# Patient Record
Sex: Male | Born: 1970 | Race: White | Hispanic: No | Marital: Married | Smoking: Former smoker
Health system: Southern US, Community
[De-identification: ages and names within clinical notes are randomized; demographics above are authoritative.]

## PROBLEM LIST (undated history)

## (undated) DIAGNOSIS — J40 Bronchitis, not specified as acute or chronic: Secondary | ICD-10-CM

## (undated) DIAGNOSIS — M1990 Unspecified osteoarthritis, unspecified site: Secondary | ICD-10-CM

## (undated) HISTORY — PX: NO PAST SURGERIES: SHX2092

---

## 2008-04-18 ENCOUNTER — Ambulatory Visit (HOSPITAL_COMMUNITY): Admission: RE | Admit: 2008-04-18 | Discharge: 2008-04-18 | Payer: Self-pay | Admitting: Family Medicine

## 2009-03-21 ENCOUNTER — Ambulatory Visit (HOSPITAL_COMMUNITY): Admission: RE | Admit: 2009-03-21 | Discharge: 2009-03-21 | Payer: Self-pay | Admitting: Family Medicine

## 2011-04-19 DIAGNOSIS — J40 Bronchitis, not specified as acute or chronic: Secondary | ICD-10-CM

## 2011-04-19 HISTORY — DX: Bronchitis, not specified as acute or chronic: J40

## 2011-06-23 ENCOUNTER — Other Ambulatory Visit (HOSPITAL_COMMUNITY): Payer: Self-pay | Admitting: Physician Assistant

## 2011-06-23 DIAGNOSIS — M25569 Pain in unspecified knee: Secondary | ICD-10-CM

## 2011-06-23 DIAGNOSIS — IMO0002 Reserved for concepts with insufficient information to code with codable children: Secondary | ICD-10-CM

## 2011-06-26 ENCOUNTER — Other Ambulatory Visit (HOSPITAL_COMMUNITY): Payer: Self-pay

## 2011-06-27 ENCOUNTER — Ambulatory Visit (HOSPITAL_COMMUNITY)
Admission: RE | Admit: 2011-06-27 | Discharge: 2011-06-27 | Disposition: A | Discharge status: Home / Self Care | Payer: BC Managed Care – PPO | Source: Ambulatory Visit | Attending: Physician Assistant | Admitting: Physician Assistant

## 2011-06-27 DIAGNOSIS — R937 Abnormal findings on diagnostic imaging of other parts of musculoskeletal system: Secondary | ICD-10-CM | POA: Insufficient documentation

## 2011-06-27 DIAGNOSIS — M25569 Pain in unspecified knee: Secondary | ICD-10-CM

## 2011-06-27 DIAGNOSIS — IMO0002 Reserved for concepts with insufficient information to code with codable children: Secondary | ICD-10-CM

## 2011-07-07 ENCOUNTER — Ambulatory Visit (INDEPENDENT_AMBULATORY_CARE_PROVIDER_SITE_OTHER): Payer: BC Managed Care – PPO | Admitting: Orthopedic Surgery

## 2011-07-07 ENCOUNTER — Encounter: Payer: Self-pay | Admitting: Orthopedic Surgery

## 2011-07-07 VITALS — BP 120/70 | Ht 68.0 in | Wt 196.0 lb

## 2011-07-07 DIAGNOSIS — M179 Osteoarthritis of knee, unspecified: Secondary | ICD-10-CM

## 2011-07-07 DIAGNOSIS — M222X9 Patellofemoral disorders, unspecified knee: Secondary | ICD-10-CM | POA: Insufficient documentation

## 2011-07-07 DIAGNOSIS — S8990XA Unspecified injury of unspecified lower leg, initial encounter: Secondary | ICD-10-CM

## 2011-07-07 DIAGNOSIS — M2240 Chondromalacia patellae, unspecified knee: Secondary | ICD-10-CM

## 2011-07-07 DIAGNOSIS — M23329 Other meniscus derangements, posterior horn of medial meniscus, unspecified knee: Secondary | ICD-10-CM

## 2011-07-07 DIAGNOSIS — M1710 Unilateral primary osteoarthritis, unspecified knee: Secondary | ICD-10-CM

## 2011-07-07 DIAGNOSIS — M25569 Pain in unspecified knee: Secondary | ICD-10-CM

## 2011-07-07 DIAGNOSIS — S99929A Unspecified injury of unspecified foot, initial encounter: Secondary | ICD-10-CM

## 2011-07-07 NOTE — Patient Instructions (Addendum)
You have been scheduled for surgery.  All surgeries carry some risk.  Remember you always have the option of continued nonsurgical treatment. However in this situation the risks vs. the benefits favor surgery as the best treatment option. The risks of the surgery includes the following but is not limited to bleeding, infection, pulmonary embolus, death from anesthesia, nerve injury vascular injury or need for further surgery, continued pain.  Arthroscopic Procedure, Knee An arthroscopic procedure can find what is wrong with your knee. PROCEDURE Arthroscopy is a surgical technique that allows your orthopedic surgeon to diagnose and treat your knee injury with accuracy. They will look into your knee through a small instrument. This is almost like a small (pencil sized) telescope. Because arthroscopy affects your knee less than open knee surgery, you can anticipate a more rapid recovery. Taking an active role by following your caregiver's instructions will help with rapid and complete recovery. Use crutches, rest, elevation, ice, and knee exercises as instructed. The length of recovery depends on various factors including type of injury, age, physical condition, medical conditions, and your rehabilitation. Your knee is the joint between the large bones (femur and tibia) in your leg. Cartilage covers these bone ends which are smooth and slippery and allow your knee to bend and move smoothly. Two menisci, thick, semi-lunar shaped pads of cartilage which form a rim inside the joint, help absorb shock and stabilize your knee. Ligaments bind the bones together and support your knee joint. Muscles move the joint, help support your knee, and take stress off the joint itself. Because of this all programs and physical therapy to rehabilitate an injured or repaired knee require rebuilding and strengthening your muscles. AFTER THE PROCEDURE  After the procedure, you will be moved to a recovery area until most of the  effects of the medication have worn off. Your caregiver will discuss the test results with you.     Only take over-the-counter or prescription medicines for pain, discomfort, or fever as directed by your caregiver.  SEEK MEDICAL CARE IF:    You have increased bleeding from your wounds.     You see redness, swelling, or have increasing pain in your wounds.     You have pus coming from your wound.     You have an oral temperature above 102 F (38.9 C).     You notice a bad smell coming from the wound or dressing.     You have severe pain with any motion of your knee.  SEEK IMMEDIATE MEDICAL CARE IF:    You develop a rash.     You have difficulty breathing.     You have any allergic problems.  Document Released: 05/02/2000 Document Revised: 01/15/2011 Document Reviewed: 11/24/2007 [facility] Patient Information 2012 [place], [place].Arthroscopic Procedure, Knee An arthroscopic procedure can find what is wrong with your knee. PROCEDURE Arthroscopy is a surgical technique that allows your orthopedic surgeon to diagnose and treat your knee injury with accuracy. They will look into your knee through a small instrument. This is almost like a small (pencil sized) telescope. Because arthroscopy affects your knee less than open knee surgery, you can anticipate a more rapid recovery. Taking an active role by following your caregiver's instructions will help with rapid and complete recovery. Use crutches, rest, elevation, ice, and knee exercises as instructed. The length of recovery depends on various factors including type of injury, age, physical condition, medical conditions, and your rehabilitation. Your knee is the joint between the large bones (femur  and tibia) in your leg. Cartilage covers these bone ends which are smooth and slippery and allow your knee to bend and move smoothly. Two menisci, thick, semi-lunar shaped pads of cartilage which form a rim inside the joint, help absorb shock and  stabilize your knee. Ligaments bind the bones together and support your knee joint. Muscles move the joint, help support your knee, and take stress off the joint itself. Because of this all programs and physical therapy to rehabilitate an injured or repaired knee require rebuilding and strengthening your muscles. AFTER THE PROCEDURE  After the procedure, you will be moved to a recovery area until most of the effects of the medication have worn off. Your caregiver will discuss the test results with you.     Only take over-the-counter or prescription medicines for pain, discomfort, or fever as directed by your caregiver.  SEEK MEDICAL CARE IF:    You have increased bleeding from your wounds.     You see redness, swelling, or have increasing pain in your wounds.     You have pus coming from your wound.     You have an oral temperature above 102 F (38.9 C).     You notice a bad smell coming from the wound or dressing.     You have severe pain with any motion of your knee.  SEEK IMMEDIATE MEDICAL CARE IF:    You develop a rash.     You have difficulty breathing.     You have any allergic problems.  Document Released: 05/02/2000 Document Revised: 01/15/2011 Document Reviewed: 11/24/2007 [facility] Patient Information 2012 [place], [place].  Call office if you need the knee drained

## 2011-07-07 NOTE — Progress Notes (Signed)
  Subjective:    Ryan Robbins is a 41 y.o. male who presents history of remote hyperextension injury to her LEFT knee playing softball about 9 years ago.  He did well basically up until this January when he was helping his father-in-law carry some cabinets up several flights of stairs.  Since that time is any increased pain on the medial side of the knee where as before he only had intermittent symptoms.  He complains of catching and swelling as well as sharp stabbing medial knee pain which he rates 6/10 and described as constant.  Hydrocodone did not relieve his pain but Vicodin which he took for back pain before this knee problem seemed to help much better.  He had some giving way episodes but none recently.  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  He had an MRI which showed he had "PCL abnormality" as well as degeneration of the medial meniscus with possible tear.  There is cyst formation on the medial compartment with joint space narrowing.  There was mild chondromalacia of the medial femoral compartment as well as patellofemoral compartment.  The following portions of the patient's history were reviewed and updated as appropriate: allergies, current medications, past family history, past medical history, past social history, past surgical history and problem list.   Review of Systems A comprehensive review of systems was negative.   Objective:    BP 120/70  Ht 5\' 8"  (1.727 m)  Wt 196 lb (88.905 kg)  BMI 29.80 kg/m2  Physical Exam(12) GENERAL: normal development , Normal frame.  Normal grooming normal hygiene.  CDV: pulses are normal   Skin: normal  Lymph: nodes were not palpable/normal  Psychiatric: awake, alert and oriented  Neuro: normal sensation  Ambulation is normal.  Right knee: normal and no effusion, full active range of motion, no joint line tenderness, ligamentous structures intact.  Left knee:    the patient exhibits a posterior tibial sag.  A 2+ posterior drawer.  A false anterior drawer.  Normal Lachman test.  Negative McMurray's.  Medial joint line tenderness.  Normal patellofemoral joint motion no apprehension.  Collateral ligaments are stable.  30 and 90 external rotation tests are normal and equal to the opposite side collateral ligaments are stable   X-ray left knee: alignment medial joint space collapse consistent with varus and loss of cartilage medially with surrounding osteophytes.  Assessment:    Left torn medial meniscus degenerative, degenerative arthritis medial compartment, PCL tear chronic.  Chondromalacia patella.    Plan:    I discussed with the patient he will need a knee replacement later in life.  I advised him to have a scope to clean up the medial compartment.  I advised him that he would not be pain free just have less pain.  I think at this point reconstruction will not prevent deterioration of the knee which is already occurred.  He will let us know when he can have the surgery.

## 2011-07-30 ENCOUNTER — Encounter: Payer: Self-pay | Admitting: Orthopedic Surgery

## 2011-07-30 ENCOUNTER — Ambulatory Visit (INDEPENDENT_AMBULATORY_CARE_PROVIDER_SITE_OTHER): Payer: BC Managed Care – PPO | Admitting: Orthopedic Surgery

## 2011-07-30 VITALS — Ht 68.0 in | Wt 196.0 lb

## 2011-07-30 DIAGNOSIS — S99919A Unspecified injury of unspecified ankle, initial encounter: Secondary | ICD-10-CM

## 2011-07-30 DIAGNOSIS — M222X9 Patellofemoral disorders, unspecified knee: Secondary | ICD-10-CM

## 2011-07-30 DIAGNOSIS — M1710 Unilateral primary osteoarthritis, unspecified knee: Secondary | ICD-10-CM

## 2011-07-30 DIAGNOSIS — M23329 Other meniscus derangements, posterior horn of medial meniscus, unspecified knee: Secondary | ICD-10-CM

## 2011-07-30 DIAGNOSIS — S8990XA Unspecified injury of unspecified lower leg, initial encounter: Secondary | ICD-10-CM

## 2011-07-30 DIAGNOSIS — M25569 Pain in unspecified knee: Secondary | ICD-10-CM

## 2011-07-30 MED ORDER — HYDROCODONE-ACETAMINOPHEN 5-500 MG PO TABS: 1.0000 | ORAL_TABLET | Freq: Four times a day (QID) | ORAL | Status: DC | PRN

## 2011-07-30 NOTE — Progress Notes (Signed)
Patient ID: Ryan Robbins, male   DOB: 1970-10-26, 41 y.o.   MRN: [id number] Chief Complaint  Patient presents with  . Follow-up    Schedule surgery on left knee.     The patient is scheduled for reevaluation of his knee and possible surgical treatment of the LEFT knee  The MRI shows that he has abnormal signal in his PCL and clinical exam confirms a PCL injury that is chronic.  Also has medial and patellofemoral compartment disease which is consistent with an old PCL tear  The medial meniscus looks to be degenerative and may need resection.  He will also knee exam under anesthesia.  The patient is counseled on his postoperative rehabilitation course  Risks and benefits are explained  Plan is for arthroscopy LEFT knee possible medial meniscectomy with exam under anesthesia to evaluate the PCL and collateral ligament restraints.

## 2011-07-30 NOTE — Patient Instructions (Signed)
You have been scheduled for surgery.  All surgeries carry some risk.  Remember you always have the option of continued nonsurgical treatment. However in this situation the risks vs. the benefits favor surgery as the best treatment option. The risks of the surgery includes the following but is not limited to bleeding, infection, pulmonary embolus, death from anesthesia, nerve injury vascular injury or need for further surgery, continued pain.  Specific to this procedure the following risks and complications are rare but possible Stiffness, pain, weakness, I expect  3-4 weeks recovery

## 2011-07-31 ENCOUNTER — Telehealth: Payer: Self-pay | Admitting: Orthopedic Surgery

## 2011-07-31 NOTE — Telephone Encounter (Signed)
Contacted insurer, [address] [place], [place] [phone], re: out-patient surgery scheduled 08/08/11 at [facility], [place] [address], [phone], ICD9 codes 719.46, 959.7, 717.2, 715.96 - Per Reche Dixon, Care management operations representative, no pre-authorization is required.  Her name and today's date for reference.

## 2011-08-01 ENCOUNTER — Encounter (HOSPITAL_COMMUNITY): Payer: Self-pay

## 2011-08-01 ENCOUNTER — Encounter (HOSPITAL_COMMUNITY)
Admission: RE | Admit: 2011-08-01 | Discharge: 2011-08-01 | Disposition: A | Discharge status: Home / Self Care | Payer: BC Managed Care – PPO | Source: Ambulatory Visit | Attending: Orthopedic Surgery | Admitting: Orthopedic Surgery

## 2011-08-01 HISTORY — DX: Bronchitis, not specified as acute or chronic: J40

## 2011-08-01 HISTORY — DX: Unspecified osteoarthritis, unspecified site: M19.90

## 2011-08-01 LAB — SURGICAL PCR SCREEN: Staphylococcus aureus: NEGATIVE

## 2011-08-01 LAB — BASIC METABOLIC PANEL
CO2: 29 mEq/L (ref 19–32)
Calcium: 10.1 mg/dL (ref 8.4–10.5)
Chloride: 101 mEq/L (ref 96–112)
Glucose, Bld: 109 mg/dL — ABNORMAL HIGH (ref 70–99)
Potassium: 4.3 mEq/L (ref 3.5–5.1)
Sodium: 141 mEq/L (ref 135–145)

## 2011-08-01 LAB — HEMOGLOBIN AND HEMATOCRIT, BLOOD
HCT: 45.2 % (ref 39.0–52.0)
Hemoglobin: 16.4 g/dL (ref 13.0–17.0)

## 2011-08-01 MED ORDER — CHLORHEXIDINE GLUCONATE 4 % EX LIQD
60.0000 mL | Freq: Once | CUTANEOUS | Status: DC
  Filled 2011-08-01: qty 60

## 2011-08-01 NOTE — Patient Instructions (Signed)
19 Ryan Robbins  08/01/2011   Your procedure is scheduled on:  Friday, 08/08/11  Report to [facility] at 0710 AM.  Call this number if you have problems the morning of surgery: [phone]   Remember:   Do not eat food:After Midnight.  May have clear liquids:until Midnight .  Clear liquids include soda, tea, black coffee, apple or grape juice, broth.  Take these medicines the morning of surgery with A SIP OF WATER: pain pill if needed   Do not wear jewelry, make-up or nail polish.  Do not wear lotions, powders, or perfumes. You may wear deodorant.  Do not shave 48 hours prior to surgery.  Do not bring valuables to the hospital.  Contacts, dentures or bridgework may not be worn into surgery.  Leave suitcase in the car. After surgery it may be brought to your room.  For patients admitted to the hospital, checkout time is 11:00 AM the day of discharge.   Patients discharged the day of surgery will not be allowed to drive home.  Name and phone number of your driver: driver  Special Instructions: CHG Shower Use Special Wash: 1/2 bottle night before surgery and 1/2 bottle morning of surgery.   Please read over the following fact sheets that you were given: Pain Booklet, MRSA Information, Surgical Site Infection Prevention, Anesthesia Post-op Instructions and Care and Recovery After Surgery   Arthroscopy Arthroscopy is a procedure in which a caregiver uses an arthroscope. An arthroscope is an instrument that allows your caregiver to look directly into a joint. It is like a small telescope attached to a video camera, and is similar in size to a pencil. Arthroscopes let your caregiver see inside your joint on an attached television monitor. Most joints in the human body can be examined and surgery can be performed through the arthroscope using small incisions. Prior to the use of arthroscopes, surgeries were done with larger open incisions, which requires longer recovery times. On occasion,  arthroscopic procedures result in complications such as bleeding, swelling and pain. If a complication results, a longer recovery and rehabilitation may be required. INDICATIONS Arthroscopic procedures were developed to remove, repair, or replace (reconstruct) damaged tissue. Arthroscopy can be preformed if the procedure involves trimming tissue, removing fragments of cartilage or bone (loose bodies) within joints, suctioning debris, biopsy of tissue, smoothing rough surfaces, removing inflamed tissue, shrinking tissue, or sewing (suturing), tacking, or stapling cartilage and ligaments. What can be done is dependent on many factors. Arthroscopy allows for surgeons to perform certain surgical procedures. Most of the surgeries you can go home the same day as the procedure (outpatient procedures) because the procedure does not cause as much trauma to the patient. Arthroscopy is a valuable diagnostic tool. Radiographs (such as x-ray and CT scans) have poor ability at showing soft tissue, whereas arthroscopy gives the caregiver direct visualization of soft tissue, cartilege, and bone. However, the emergence of magnetic resonance imaging (MRI) has lessened the need for arthroscopy as a diagnostic tool.  TECHNIQUE  Repair and reconstruction arthroscopic techniques may require additional and/or larger incisions than diagnostic arthroscopy portals (1/4 inch incisions). The procedures are often more extensive in repair and reconstruction, than excision procedures. Therefore, the patient may need to stay in the hospital overnight after arthroscopic repair or reconstruction. These procedures also disrupt more tissue, and discomfort may occur, so the temporary use of braces, casts, or crutches, as well as rehabilitation, may be needed.   In order to undergo an arthroscopic procedure,  a complete evaluation is necessary in order to provide the caregiver with as accurate of a diagnosis as possible. Sometimes it is  necessary to perform diagnostic arthroscopy before another surgery can be scheduled.   Both diagnostic and surgical arthroscopy can be performed under local anesthesia (only the joint is numbed), regional anesthesia (the operative limb is numbed), spinal or epidural anesthesia (only the lower extremities are numbed), or general anesthesia (you are completely asleep). The type of anesthetic is dependent on the patient, the surgeon, and the procedure being performed.   If you ask prior to the operation, you may be able to obtain pictures or a video from the arthroscopic camera.   Do not eat or drink anything for at least 8 hours before surgery. Food and drinks (including coffee) make general anesthesia more hazardous.  SEEK MEDICAL CARE IF:  You experience pain, numbness, or coldness in the extremity operated on.   Blue, gray, or dark color appears in the fingers or toenails.   You have increased pain, swelling, redness, drainage, or bleeding in the surgical area despite rest, ice, elevation, and pain medications.   You have signs of infection, including a fever 102 F (38.9 C) or higher.  Document Released: 12/04/2004 Document Revised: 04/24/2011 Document Reviewed: 08/17/2008 [facility] Patient Information 2012 [place], [place].

## 2011-08-04 ENCOUNTER — Encounter (HOSPITAL_COMMUNITY): Payer: Self-pay | Admitting: Pharmacy Technician

## 2011-08-05 ENCOUNTER — Encounter (HOSPITAL_COMMUNITY): Payer: BC Managed Care – PPO

## 2011-08-07 NOTE — H&P (Signed)
Ryan Robbins is an 41 y.o. male.   Subjective:   Ryan Robbins is a 41 y.o. male who presents history of remote hyperextension injury to her LEFT knee playing softball about 9 years ago. He did well basically up until this January when he was helping his father-in-law carry some cabinets up several flights of stairs. Since that time is any increased pain on the medial side of the knee where as before he only had intermittent symptoms. He complains of catching and swelling as well as sharp stabbing medial knee pain which he rates 6/10 and described as constant.  Hydrocodone did not relieve his pain but Vicodin which he took for back pain before this knee problem seemed to help much better.  He had some giving way episodes but none recently. 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  He had an MRI which showed he had "PCL abnormality" as well as degeneration of the medial meniscus with possible tear. There is cyst formation on the medial compartment with joint space narrowing. There was mild chondromalacia of the medial femoral compartment as well as patellofemoral compartment.  The following portions of the patient's history were reviewed and updated as appropriate: allergies, current medications, past family history, past medical history, past social history, past surgical history and problem list.  Review of Systems  A comprehensive review of systems was negative.  Objective:   BP 120/70  Ht 5\' 8"  (1.727 m)  Wt 196 lb (88.905 kg)  BMI 29.80 kg/m2  Physical Exam(12)  GENERAL: normal development , Normal frame. Normal grooming normal hygiene.  CDV: pulses are normal  Skin: normal  Lymph: nodes were not palpable/normal  Psychiatric: awake, alert and oriented  Neuro: normal sensation  Ambulation is normal.  Right knee:  normal and no effusion, full active range of motion, no joint line tenderness, ligamentous structures intact.     Left knee:  the patient exhibits a posterior tibial sag. A 2+ posterior drawer. A false anterior drawer. Normal Lachman test. Negative McMurray's. Medial joint line tenderness. Normal patellofemoral joint motion no apprehension. Collateral ligaments are stable. 30 and 90 external rotation tests are normal and equal to the opposite side collateral ligaments are stable     Past Medical History  Diagnosis Date  . Bronchitis 12/12  . Arthritis     Past Surgical History  Procedure Date  . No past surgeries     Family History  Problem Relation Age of Onset  . Heart disease    . Diabetes    . Heart disease Mother   . Diabetes Father   . Heart disease Father   . Anesthesia problems Neg Hx   . Hypotension Neg Hx   . Malignant hyperthermia Neg Hx   . Pseudochol deficiency Neg Hx    Social History:  reports that he has quit smoking. He does not have any smokeless tobacco history on file. He reports that he does not drink alcohol or use illicit drugs.  Allergies:  Allergies  Allergen Reactions  . Tramadol Itching    No current facility-administered medications on file as of .   Medications Prior to Admission  Medication Sig Dispense Refill  . HYDROcodone-acetaminophen (VICODIN) 5-500 MG per tablet Take 1 tablet by mouth every 6 (six) hours as needed. For pain        No results found for this or any previous visit (from the past  48 hour(s)). No results found.  Review of Systems  Musculoskeletal: Positive for joint pain.  All other systems reviewed and are negative.    There were no vitals taken for this visit. Physical Exam   X-ray left knee: alignment medial joint space collapse consistent with varus and loss of cartilage medially with surrounding osteophytes.  Assessment:   Left torn medial meniscus degenerative, degenerative arthritis medial compartment, PCL tear chronic. Chondromalacia patella.  Plan:   I discussed with the patient he will need a knee replacement  later in life. I advised him to have a scope to clean up the medial compartment. I advised him that he would not be pain free just have less pain. I think at this point reconstruction will not prevent deterioration of the knee which is already occurred.  He will let us know when he can have the surgery. SALK EUA MEDIAL MENISECTOMY   Fuller Canada 08/07/2011, 1:18 PM

## 2011-08-08 ENCOUNTER — Encounter (HOSPITAL_COMMUNITY): Payer: Self-pay | Admitting: Anesthesiology

## 2011-08-08 ENCOUNTER — Encounter (HOSPITAL_COMMUNITY)
Admission: RE | Disposition: A | Discharge status: Home / Self Care | Payer: Self-pay | Source: Ambulatory Visit | Attending: Orthopedic Surgery

## 2011-08-08 ENCOUNTER — Encounter (HOSPITAL_COMMUNITY): Payer: Self-pay | Admitting: *Deleted

## 2011-08-08 ENCOUNTER — Ambulatory Visit (HOSPITAL_COMMUNITY)
Admission: RE | Admit: 2011-08-08 | Discharge: 2011-08-08 | Disposition: A | Discharge status: Home / Self Care | Payer: BC Managed Care – PPO | Source: Ambulatory Visit | Attending: Orthopedic Surgery | Admitting: Orthopedic Surgery

## 2011-08-08 ENCOUNTER — Ambulatory Visit (HOSPITAL_COMMUNITY): Payer: BC Managed Care – PPO | Admitting: Anesthesiology

## 2011-08-08 DIAGNOSIS — M23329 Other meniscus derangements, posterior horn of medial meniscus, unspecified knee: Secondary | ICD-10-CM

## 2011-08-08 DIAGNOSIS — S8990XA Unspecified injury of unspecified lower leg, initial encounter: Secondary | ICD-10-CM

## 2011-08-08 DIAGNOSIS — M23305 Other meniscus derangements, unspecified medial meniscus, unspecified knee: Secondary | ICD-10-CM | POA: Insufficient documentation

## 2011-08-08 DIAGNOSIS — M1710 Unilateral primary osteoarthritis, unspecified knee: Secondary | ICD-10-CM

## 2011-08-08 DIAGNOSIS — M2350 Chronic instability of knee, unspecified knee: Secondary | ICD-10-CM | POA: Insufficient documentation

## 2011-08-08 DIAGNOSIS — IMO0002 Reserved for concepts with insufficient information to code with codable children: Secondary | ICD-10-CM | POA: Insufficient documentation

## 2011-08-08 DIAGNOSIS — M222X9 Patellofemoral disorders, unspecified knee: Secondary | ICD-10-CM

## 2011-08-08 DIAGNOSIS — M25569 Pain in unspecified knee: Secondary | ICD-10-CM

## 2011-08-08 SURGERY — MANIPULATION, JOINT, KNEE, WITH ANESTHESIA
Anesthesia: General | Site: Knee | Laterality: Left | Wound class: Clean

## 2011-08-08 MED ORDER — LACTATED RINGERS IV SOLN
INTRAVENOUS | Status: DC
  Administered 2011-08-08: 08:00:00 via INTRAVENOUS

## 2011-08-08 MED ORDER — CEFAZOLIN SODIUM-DEXTROSE 2-3 GM-% IV SOLR: 2.0000 g | INTRAVENOUS | Status: DC

## 2011-08-08 MED ORDER — ACETAMINOPHEN 10 MG/ML IV SOLN
1000.0000 mg | Freq: Once | INTRAVENOUS | Status: AC
  Administered 2011-08-08: 1000 mg via INTRAVENOUS

## 2011-08-08 MED ORDER — PROPOFOL 10 MG/ML IV EMUL
INTRAVENOUS | Status: AC
  Filled 2011-08-08: qty 20

## 2011-08-08 MED ORDER — GLYCOPYRROLATE 0.2 MG/ML IJ SOLN
INTRAMUSCULAR | Status: AC
  Filled 2011-08-08: qty 1

## 2011-08-08 MED ORDER — ONDANSETRON HCL 4 MG/2ML IJ SOLN: 4.0000 mg | Freq: Once | INTRAMUSCULAR | Status: DC | PRN

## 2011-08-08 MED ORDER — CEFAZOLIN SODIUM 1-5 GM-% IV SOLN
INTRAVENOUS | Status: DC | PRN
  Administered 2011-08-08: 2 g via INTRAVENOUS

## 2011-08-08 MED ORDER — LIDOCAINE HCL (CARDIAC) 10 MG/ML IV SOLN
INTRAVENOUS | Status: DC | PRN
  Administered 2011-08-08: 50 mg via INTRAVENOUS

## 2011-08-08 MED ORDER — SODIUM CHLORIDE 0.9 % IR SOLN
Status: DC | PRN
  Administered 2011-08-08: 10:00:00

## 2011-08-08 MED ORDER — HYDROCODONE-ACETAMINOPHEN 7.5-325 MG PO TABS: 1.0000 | ORAL_TABLET | ORAL | Status: AC | PRN

## 2011-08-08 MED ORDER — MIDAZOLAM HCL 2 MG/2ML IJ SOLN
1.0000 mg | INTRAMUSCULAR | Status: DC | PRN
  Administered 2011-08-08: 2 mg via INTRAVENOUS

## 2011-08-08 MED ORDER — BACTERIOSTATIC WATER(BENZ ALC) IJ SOLN
INTRAMUSCULAR | Status: AC
  Filled 2011-08-08: qty 30

## 2011-08-08 MED ORDER — ACETAMINOPHEN 325 MG PO TABS: 325.0000 mg | ORAL_TABLET | ORAL | Status: DC | PRN

## 2011-08-08 MED ORDER — FENTANYL CITRATE 0.05 MG/ML IJ SOLN
INTRAMUSCULAR | Status: DC | PRN
  Administered 2011-08-08 (×2): 25 ug via INTRAVENOUS
  Administered 2011-08-08 (×2): 50 ug via INTRAVENOUS
  Administered 2011-08-08 (×2): 25 ug via INTRAVENOUS

## 2011-08-08 MED ORDER — OXYCODONE HCL 5 MG PO TABS
ORAL_TABLET | ORAL | Status: AC
  Administered 2011-08-08: 5 mg via ORAL
  Filled 2011-08-08: qty 1

## 2011-08-08 MED ORDER — LACTATED RINGERS IV SOLN
INTRAVENOUS | Status: DC | PRN
  Administered 2011-08-08: 08:00:00 via INTRAVENOUS

## 2011-08-08 MED ORDER — SODIUM CHLORIDE 0.9 % IR SOLN
Status: DC | PRN
  Administered 2011-08-08: 1000 mL

## 2011-08-08 MED ORDER — CELECOXIB 100 MG PO CAPS
400.0000 mg | ORAL_CAPSULE | Freq: Once | ORAL | Status: AC
  Administered 2011-08-08: 400 mg via ORAL

## 2011-08-08 MED ORDER — EPINEPHRINE HCL 1 MG/ML IJ SOLN
INTRAMUSCULAR | Status: AC
  Filled 2011-08-08: qty 8

## 2011-08-08 MED ORDER — ONDANSETRON HCL 4 MG/2ML IJ SOLN
INTRAMUSCULAR | Status: AC
  Administered 2011-08-08: 4 mg via INTRAVENOUS
  Filled 2011-08-08: qty 2

## 2011-08-08 MED ORDER — ONDANSETRON HCL 4 MG/2ML IJ SOLN
4.0000 mg | Freq: Once | INTRAMUSCULAR | Status: AC
  Administered 2011-08-08: 4 mg via INTRAVENOUS

## 2011-08-08 MED ORDER — GLYCOPYRROLATE 0.2 MG/ML IJ SOLN
0.2000 mg | Freq: Once | INTRAMUSCULAR | Status: AC
  Administered 2011-08-08: 0.2 mg via INTRAVENOUS

## 2011-08-08 MED ORDER — ONDANSETRON HCL 4 MG/2ML IJ SOLN
INTRAMUSCULAR | Status: AC
  Filled 2011-08-08: qty 2

## 2011-08-08 MED ORDER — BUPIVACAINE-EPINEPHRINE PF 0.5-1:200000 % IJ SOLN
INTRAMUSCULAR | Status: AC
  Filled 2011-08-08: qty 20

## 2011-08-08 MED ORDER — CEFAZOLIN SODIUM-DEXTROSE 2-3 GM-% IV SOLR
INTRAVENOUS | Status: AC
  Filled 2011-08-08: qty 50

## 2011-08-08 MED ORDER — FENTANYL CITRATE 0.05 MG/ML IJ SOLN: 25.0000 ug | INTRAMUSCULAR | Status: DC | PRN

## 2011-08-08 MED ORDER — CELECOXIB 100 MG PO CAPS
ORAL_CAPSULE | ORAL | Status: AC
  Administered 2011-08-08: 400 mg via ORAL
  Filled 2011-08-08: qty 4

## 2011-08-08 MED ORDER — BUPIVACAINE-EPINEPHRINE PF 0.5-1:200000 % IJ SOLN
INTRAMUSCULAR | Status: DC | PRN
  Administered 2011-08-08: 60 mL

## 2011-08-08 MED ORDER — CEFAZOLIN SODIUM 1 G IJ SOLR
INTRAMUSCULAR | Status: AC
  Filled 2011-08-08: qty 10

## 2011-08-08 MED ORDER — PROPOFOL 10 MG/ML IV EMUL
INTRAVENOUS | Status: DC | PRN
  Administered 2011-08-08: 200 mg via INTRAVENOUS

## 2011-08-08 MED ORDER — PROMETHAZINE HCL 12.5 MG PO TABS: 12.5000 mg | ORAL_TABLET | Freq: Four times a day (QID) | ORAL | Status: AC | PRN

## 2011-08-08 MED ORDER — LIDOCAINE HCL (PF) 1 % IJ SOLN
INTRAMUSCULAR | Status: AC
  Filled 2011-08-08: qty 5

## 2011-08-08 MED ORDER — FENTANYL CITRATE 0.05 MG/ML IJ SOLN
INTRAMUSCULAR | Status: AC
  Filled 2011-08-08: qty 2

## 2011-08-08 MED ORDER — ENOXAPARIN SODIUM 40 MG/0.4ML ~~LOC~~ SOLN
SUBCUTANEOUS | Status: AC
  Filled 2011-08-08: qty 0.4

## 2011-08-08 MED ORDER — ACETAMINOPHEN 10 MG/ML IV SOLN
INTRAVENOUS | Status: AC
  Administered 2011-08-08: 1000 mg via INTRAVENOUS
  Filled 2011-08-08: qty 100

## 2011-08-08 MED ORDER — MIDAZOLAM HCL 2 MG/2ML IJ SOLN
INTRAMUSCULAR | Status: AC
  Filled 2011-08-08: qty 2

## 2011-08-08 MED ORDER — OXYCODONE HCL 5 MG PO TABS
5.0000 mg | ORAL_TABLET | ORAL | Status: DC
  Administered 2011-08-08: 5 mg via ORAL

## 2011-08-08 SURGICAL SUPPLY — 55 items
ARTHROWAND PARAGON T2 (SURGICAL WAND)
BAG HAMPER (MISCELLANEOUS) ×3 IMPLANT
BANDAGE ELASTIC 6 VELCRO NS (GAUZE/BANDAGES/DRESSINGS) ×3 IMPLANT
BLADE AGGRESSIVE PLUS 4.0 (BLADE) ×3 IMPLANT
BLADE SURG SZ11 CARB STEEL (BLADE) ×3 IMPLANT
CHLORAPREP W/TINT 26ML (MISCELLANEOUS) ×3 IMPLANT
CLOTH BEACON ORANGE TIMEOUT ST (SAFETY) ×3 IMPLANT
COOLER CRYO IC GRAV AND TUBE (ORTHOPEDIC SUPPLIES) ×3 IMPLANT
COVER PROBE W GEL 5X96 (DRAPES) ×3 IMPLANT
CUFF CRYO KNEE LG 20X31 COOLER (ORTHOPEDIC SUPPLIES) IMPLANT
CUFF CRYO KNEE18X23 MED (MISCELLANEOUS) ×3 IMPLANT
CUFF TOURNIQUET SINGLE 34IN LL (TOURNIQUET CUFF) ×3 IMPLANT
CUFF TOURNIQUET SINGLE 44IN (TOURNIQUET CUFF) IMPLANT
CUTTER ANGLED DBL BITE 4.5 (BURR) IMPLANT
DECANTER SPIKE VIAL GLASS SM (MISCELLANEOUS) ×6 IMPLANT
DRSG PAD ABDOMINAL 8X10 ST (GAUZE/BANDAGES/DRESSINGS) ×3 IMPLANT
FLOOR PAD 36X40 (MISCELLANEOUS) ×3
GAUZE SPONGE 4X4 16PLY XRAY LF (GAUZE/BANDAGES/DRESSINGS) ×3 IMPLANT
GAUZE XEROFORM 5X9 LF (GAUZE/BANDAGES/DRESSINGS) ×3 IMPLANT
GLOVE BIOGEL PI IND STRL 7.0 (GLOVE) ×4 IMPLANT
GLOVE BIOGEL PI INDICATOR 7.0 (GLOVE) ×2
GLOVE ECLIPSE 6.5 STRL STRAW (GLOVE) ×6 IMPLANT
GLOVE SKINSENSE NS SZ8.0 LF (GLOVE) ×1
GLOVE SKINSENSE STRL SZ8.0 LF (GLOVE) ×2 IMPLANT
GLOVE SS N UNI LF 8.5 STRL (GLOVE) ×3 IMPLANT
GOWN STRL REIN XL XLG (GOWN DISPOSABLE) ×9 IMPLANT
HLDR LEG FOAM (MISCELLANEOUS) ×2 IMPLANT
IV NS IRRIG 3000ML ARTHROMATIC (IV SOLUTION) ×12 IMPLANT
KIT BLADEGUARD II DBL (SET/KITS/TRAYS/PACK) ×3 IMPLANT
KIT ROOM TURNOVER AP CYSTO (KITS) ×3 IMPLANT
LEG HOLDER FOAM (MISCELLANEOUS) ×1
MANIFOLD NEPTUNE II (INSTRUMENTS) ×3 IMPLANT
MARKER SKIN DUAL TIP RULER LAB (MISCELLANEOUS) ×3 IMPLANT
NEEDLE HYPO 18GX1.5 BLUNT FILL (NEEDLE) ×3 IMPLANT
NEEDLE HYPO 21X1.5 SAFETY (NEEDLE) ×3 IMPLANT
NEEDLE SPNL 18GX3.5 QUINCKE PK (NEEDLE) ×3 IMPLANT
NS IRRIG 1000ML POUR BTL (IV SOLUTION) ×3 IMPLANT
PACK ARTHRO LIMB DRAPE STRL (MISCELLANEOUS) ×3 IMPLANT
PAD ABD 5X9 TENDERSORB (GAUZE/BANDAGES/DRESSINGS) ×3 IMPLANT
PAD ARMBOARD 7.5X6 YLW CONV (MISCELLANEOUS) ×3 IMPLANT
PAD FLOOR 36X40 (MISCELLANEOUS) ×2 IMPLANT
PADDING CAST COTTON 6X4 STRL (CAST SUPPLIES) ×3 IMPLANT
PIN STMN 9X.142IN (PIN) ×3 IMPLANT
SET ARTHROSCOPY INST (INSTRUMENTS) ×3 IMPLANT
SET ARTHROSCOPY PUMP TUBE (IRRIGATION / IRRIGATOR) ×3 IMPLANT
SET BASIN LINEN APH (SET/KITS/TRAYS/PACK) ×3 IMPLANT
SPONGE GAUZE 4X4 12PLY (GAUZE/BANDAGES/DRESSINGS) ×3 IMPLANT
STRIP CLOSURE SKIN 1/2X4 (GAUZE/BANDAGES/DRESSINGS) ×3 IMPLANT
SUT ETHILON 3 0 FSL (SUTURE) IMPLANT
SYR 30ML LL (SYRINGE) ×3 IMPLANT
SYRINGE 10CC LL (SYRINGE) ×3 IMPLANT
WAND 50 DEG COVAC W/CORD (SURGICAL WAND) ×3 IMPLANT
WAND 90 DEG TURBOVAC W/CORD (SURGICAL WAND) IMPLANT
WAND ARTHRO PARAGON T2 (SURGICAL WAND) IMPLANT
YANKAUER SUCT BULB TIP 10FT TU (MISCELLANEOUS) ×12 IMPLANT

## 2011-08-08 NOTE — Anesthesia Postprocedure Evaluation (Signed)
  Anesthesia Post-op Note  Patient: Ryan Robbins  Procedure(s) Performed: Procedure(s) (LRB): EXAM UNDER ANESTHESIA WITH MANIPULATION OF KNEE (Left) KNEE ARTHROSCOPY WITH MEDIAL MENISECTOMY (Left)  Patient Location: PACU  Anesthesia Type: General  Level of Consciousness: awake, alert , oriented and patient cooperative  Airway and Oxygen Therapy: Patient Spontanous Breathing  Post-op Pain: mild  Post-op Assessment: Post-op Vital signs reviewed, Patient's Cardiovascular Status Stable, Respiratory Function Stable and Patent Airway  Post-op Vital Signs: Reviewed and stable  Complications: No apparent anesthesia complications

## 2011-08-08 NOTE — Anesthesia Preprocedure Evaluation (Addendum)
Anesthesia Evaluation  Patient identified by MRN, date of birth, ID band Patient awake    Reviewed: Allergy & Precautions, H&P , NPO status , Patient's Chart, lab work & pertinent test results  Airway Mallampati: I      Dental No notable dental hx.    Pulmonary neg pulmonary ROS,    Pulmonary exam normal       Cardiovascular negative cardio ROS  Rhythm:Regular Rate:Normal     Neuro/Psych negative neurological ROS  negative psych ROS   GI/Hepatic negative GI ROS, Neg liver ROS,   Endo/Other  negative endocrine ROS  Renal/GU negative Renal ROS     Musculoskeletal negative musculoskeletal ROS (+)   Abdominal Normal abdominal exam  (+)   Peds  Hematology negative hematology ROS (+)   Anesthesia Other Findings   Reproductive/Obstetrics                          Anesthesia Physical Anesthesia Plan  ASA: I  Anesthesia Plan: General   Post-op Pain Management:    Induction: Intravenous  Airway Management Planned: LMA  Additional Equipment:   Intra-op Plan:   Post-operative Plan: Extubation in OR  Informed Consent: I have reviewed the patients History and Physical, chart, labs and discussed the procedure including the risks, benefits and alternatives for the proposed anesthesia with the patient or authorized representative who has indicated his/her understanding and acceptance.     Plan Discussed with: CRNA  Anesthesia Plan Comments:         Anesthesia Quick Evaluation

## 2011-08-08 NOTE — Transfer of Care (Signed)
Immediate Anesthesia Transfer of Care Note  Patient: Ryan Robbins  Procedure(s) Performed: Procedure(s) (LRB): EXAM UNDER ANESTHESIA WITH MANIPULATION OF KNEE (Left) KNEE ARTHROSCOPY WITH MEDIAL MENISECTOMY (Left)  Patient Location: PACU  Anesthesia Type: General  Level of Consciousness: awake, alert , oriented and patient cooperative  Airway & Oxygen Therapy: Patient Spontanous Breathing  Post-op Assessment: Report given to PACU RN and Post -op Vital signs reviewed and stable  Post vital signs: Reviewed and stable  Complications: No apparent anesthesia complications

## 2011-08-08 NOTE — Interval H&P Note (Signed)
History and Physical Interval Note:  08/08/2011 8:54 AM  Ryan Robbins  has presented today for surgery, with the diagnosis of osteoarthritis left knee  The various methods of treatment have been discussed with the patient and family. After consideration of risks, benefits and other options for treatment, the patient has consented to  Procedure(s) (LRB): ARTHROSCOPY KNEE (Left) EXAM UNDER ANESTHESIA WITH MANIPULATION OF KNEE (Left) as a surgical intervention .  The patients' history has been reviewed, patient examined, no change in status, stable for surgery.  I have reviewed the patients' chart and labs.  Questions were answered to the patient's satisfaction.     Fuller Canada

## 2011-08-08 NOTE — Brief Op Note (Signed)
08/08/2011  10:32 AM  PATIENT:  Karie Fetch  41 y.o. male  PRE-OPERATIVE DIAGNOSIS:  osteoarthritis left knee, pcl tear medial meniscus tear   POST-OPERATIVE DIAGNOSIS:  osteoarthritis left knee, posterior cruciate ligament tear,meniscal tear left knee  PROCEDURE:  Procedure(s) (LRB): EXAM UNDER ANESTHESIA (Left) KNEE ARTHROSCOPY WITH MEDIAL MENISECTOMY (Left) MICROFRACTURE MEDIAL FEMORAL CONDYLE   Operative findings Posterior cruciate ligament grade 2 tear. Collateral ligaments stable at 0 and 30. The patient is to 90 and 30 were normal Intraoperative view of the knee revealed  Medial compartment arthritis femoral and tibial grade 4, torn medial meniscus mid body and root. PCL fibers intact pseudo-laxity anterior cruciate ligament PCL intact lateral compartment normal. Patellofemoral joint trochlear lesion grade 3 medial patella cartilage grade 2    SURGEON:  Surgeon(s) and Role:    * Vickki Hearing, MD - Primary  PHYSICIAN ASSISTANT:   ASSISTANTS: none   ANESTHESIA:   general  EBL:  Total I/O In: 800 [I.V.:800] Out: 0   BLOOD ADMINISTERED:none  DRAINS: no  LOCAL MEDICATIONS USED:  MARCAINE   With epi 60 cc  SPECIMEN:  No Specimen  DISPOSITION OF SPECIMEN:  N/A  COUNTS:  YES  TOURNIQUET:  * Missing tourniquet times found for documented tourniquets in log:  29103 *  DICTATION: .Dragon Dictation  PLAN OF CARE: Discharge to home after PACU  PATIENT DISPOSITION:  PACU - hemodynamically stable.   Delay start of Pharmacological VTE agent (>24hrs) due to surgical blood loss or risk of bleeding: not applicable

## 2011-08-08 NOTE — Preoperative (Signed)
Beta Blockers   Reason not to administer Beta Blockers:Not Applicable 

## 2011-08-08 NOTE — Anesthesia Procedure Notes (Signed)
Procedure Name: LMA Insertion Date/Time: 08/08/2011 9:14 AM Performed by: Carolyne Littles, Mikahla Wisor L Pre-anesthesia Checklist: Patient identified, Timeout performed, Emergency Drugs available, Suction available and Patient being monitored Patient Re-evaluated:Patient Re-evaluated prior to inductionOxygen Delivery Method: Circle system utilized Preoxygenation: Pre-oxygenation with 100% oxygen Intubation Type: IV induction Ventilation: Mask ventilation without difficulty LMA: LMA inserted LMA Size: 4.0 Number of attempts: 1 Placement Confirmation: breath sounds checked- equal and bilateral and positive ETCO2 Tube secured with: Tape Dental Injury: Teeth and Oropharynx as per pre-operative assessment

## 2011-08-09 NOTE — Op Note (Signed)
08/08/2011  10:32 AM  PATIENT: Ryan Robbins 41 y.o. male  PRE-OPERATIVE DIAGNOSIS: osteoarthritis left knee, pcl tear medial meniscus tear  POST-OPERATIVE DIAGNOSIS: osteoarthritis left knee, posterior cruciate ligament tear,meniscal tear left knee  PROCEDURE: Procedure(s) (LRB):  EXAM UNDER ANESTHESIA (Left)  KNEE ARTHROSCOPY WITH MEDIAL MENISECTOMY (Left)  MICROFRACTURE MEDIAL FEMORAL CONDYLE  Operative findings  Posterior cruciate ligament grade 2 tear. Collateral ligaments stable at 0 and 30. The patient is to 90 and 30 were normal  Intraoperative view of the knee revealed  Medial compartment arthritis femoral and tibial grade 4, torn medial meniscus mid body and root. PCL fibers intact pseudo-laxity anterior cruciate ligament PCL intact lateral compartment normal. Patellofemoral joint trochlear lesion grade 3 medial patella cartilage grade 2   Details: Surgical site marking was performed in the preop area. Chart review was performed as well. The patient was taken to the operating suite for general anesthesia to after successful anesthesia exam under anesthesia was performed. The posterior cruciate ligament was noted to have a grade 2 laxity. Collateral ligaments were stable including rotational stress tests which revealed no collateral ligament instability  The operative limb was placed in an arthroscopic leg holder. A padded well leg holder was used to secure the right lower extremity  Adequate sterile prep and drape was performed, timeout procedure was executed. Left knee was confirmed as surgical site  The arthroscopic portals were infiltrated with Marcaine with epinephrine solution. 11 blade was used to establish a lateral portal. The scope was introduced into the medial compartment through the lateral portal. A diagnostic arthroscopy was completed. Medial portal was established. Arthroscopic probe was placed in the joint and the diagnostic arthroscopy was repeated.  Medial meniscal  tear was noted to be flipped into the medial gutter this was resected with a full radius resector. The meniscus was then balanced with an ArthroCare wand 50.  Probe was used to confirm stable rim.  A second portal was made on the medial side with the assistance of a spinal needle to address the grade 4 chondral lesion of the medial femoral condyle. A microfracture was then performed. The arthroscopic fluid was then turned off and a bleeding bed was noted from the microfracture site.  Marrow contents were also observed to be expressed from the microfracture area.  The posterior cruciate ligament fibers were still intact to the ligament was loose. Pseudo-laxity of the anterior cruciate ligament is also observed. Intradermal over the anterior cruciate ligament was found to be intact and at its normal tension.  The joint was irrigated. Portal sites were closed with 3-0 nylon suture.  The joint was infiltrated with 60 cc of Marcaine with epinephrine.  Sterile dressings were applied. A Cryo/Cuff was placed and activated.   SURGEON: Surgeon(s) and Role:  * Vickki Hearing, MD - Primary  PHYSICIAN ASSISTANT:  ASSISTANTS: none  ANESTHESIA: general  EBL: Total I/O  In: 800 [I.V.:800]  Out: 0  BLOOD ADMINISTERED:none  DRAINS: no  LOCAL MEDICATIONS USED: MARCAINE With epi 60 cc  SPECIMEN: No Specimen  DISPOSITION OF SPECIMEN: N/A  COUNTS: YES  TOURNIQUET: * Missing tourniquet times found for documented tourniquets in log: 29103 *  DICTATION: .Dragon Dictation  PLAN OF CARE: Discharge to home after PACU  PATIENT DISPOSITION: PACU - hemodynamically stable.  Delay start of Pharmacological VTE agent (>24hrs) due to surgical blood loss or risk of bleeding: not applicable

## 2011-08-11 ENCOUNTER — Encounter: Payer: Self-pay | Admitting: Orthopedic Surgery

## 2011-08-11 ENCOUNTER — Ambulatory Visit (INDEPENDENT_AMBULATORY_CARE_PROVIDER_SITE_OTHER): Payer: BC Managed Care – PPO | Admitting: Orthopedic Surgery

## 2011-08-11 VITALS — BP 100/62 | Ht 68.0 in | Wt 187.0 lb

## 2011-08-11 DIAGNOSIS — Z9889 Other specified postprocedural states: Secondary | ICD-10-CM

## 2011-08-11 MED ORDER — HYDROMORPHONE HCL 2 MG PO TABS: ORAL_TABLET | ORAL | Status: DC

## 2011-08-11 NOTE — Progress Notes (Signed)
Patient ID: Ryan Robbins, male   DOB: Mar 23, 1971, 41 y.o.   MRN: [id number] Chief Complaint  Patient presents with  . Follow-up    post op 1 left knee DOS    Operative findings were exam under anesthesia and posterior cruciate ligament grade 2 laxity collateral ligaments, stable.  Intraoperative findings, medial compartment arthritis, grade 4, torn medial meniscus mid body and root. Pseudo-laxity. ACL secondary to PCL laxity. Patellofemoral and trochlear, grade 3, and 2 lesions, respectively.  Procedure microfracture, medial femoral condyle arthroscopy and medial meniscectomy, LEFT knee.  The patient did have quite a bit of bleeding. His dressing.  Does have some swelling in the knee and some discoloration to the skin.  Sutures were removed.  He can start therapy and followup in 2 weeks

## 2011-08-11 NOTE — Patient Instructions (Signed)
Call hospital to start PT

## 2011-08-12 ENCOUNTER — Encounter: Payer: Self-pay | Admitting: Orthopedic Surgery

## 2011-08-13 ENCOUNTER — Ambulatory Visit (HOSPITAL_COMMUNITY)
Admission: RE | Admit: 2011-08-13 | Discharge: 2011-08-13 | Disposition: A | Discharge status: Home / Self Care | Payer: BC Managed Care – PPO | Source: Ambulatory Visit | Attending: Orthopedic Surgery | Admitting: Orthopedic Surgery

## 2011-08-13 DIAGNOSIS — R29898 Other symptoms and signs involving the musculoskeletal system: Secondary | ICD-10-CM | POA: Insufficient documentation

## 2011-08-13 DIAGNOSIS — R262 Difficulty in walking, not elsewhere classified: Secondary | ICD-10-CM | POA: Insufficient documentation

## 2011-08-13 DIAGNOSIS — IMO0001 Reserved for inherently not codable concepts without codable children: Secondary | ICD-10-CM | POA: Insufficient documentation

## 2011-08-13 DIAGNOSIS — M6281 Muscle weakness (generalized): Secondary | ICD-10-CM | POA: Insufficient documentation

## 2011-08-13 DIAGNOSIS — M25569 Pain in unspecified knee: Secondary | ICD-10-CM | POA: Insufficient documentation

## 2011-08-13 DIAGNOSIS — M25669 Stiffness of unspecified knee, not elsewhere classified: Secondary | ICD-10-CM | POA: Insufficient documentation

## 2011-08-13 NOTE — Evaluation (Signed)
Physical Therapy Evaluation  Patient Details  Name: Ryan Robbins MRN: [id number] Date of Birth: Sep 20, 1970  Today's Date: 08/13/2011 Time: 4098-1191 Time Calculation (min): 45 min Eval: one Visit#: 1  of 12   Re-eval: 09/12/11 Assessment Diagnosis: medial menisectomy Surgical Date: 08/08/11 Next MD Visit: 08/25/11 Prior Therapy: none  Past Medical History:  Past Medical History  Diagnosis Date  . Bronchitis 12/12  . Arthritis    Past Surgical History:  Past Surgical History  Procedure Date  . No past surgeries     Subjective Symptoms/Limitations Symptoms: Pt states that he tore his PCL eight years ago playing softball.  The patient states his knee would hurt on and off until a month ago when he helped a friend move and helped his father take some cabinets up the steps and from then on he has had constant  Pain.  He, therefore,  opted to have arthroscopic surgery which was doneon 08/08/10.  He is now being referred to therapy to return the patient to maximal functional potential.  The patient has been continuing to ice and bend his knee. How long can you sit comfortably?: The patient is able to sit for about an hour before he needs to get up.  How long can you stand comfortably?: The patient states that he is able to stand for fifteen to twenty minutes before he feels that he needs to sit down. How long can you walk comfortably?: The patient is stiff once he first gets up and after 15-20 minutes he experiences increased pain. Special Tests: The patient is taking pain meds to stay asleep. Pain Assessment Currently in Pain?: Yes (highest pain in the past 3 days 6/10) Pain Score:   2 Pain Location: Knee Pain Orientation: Left;Lateral Pain Type: Surgical pain Pain Relieving Factors: ice; pain meds Effect of Pain on Daily Activities: increases pain. 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 Leisure: Hobbies-no   Assessment LLE AROM (degrees) Left Knee Extension 0-130: 8  Left Knee Flexion 0-140: 127  LLE Strength Left Hip Flexion: 4/5 Left Hip Extension: 4/5 Left Hip ABduction: 5/5 Left Hip ADduction: 5/5 Left Knee Flexion:  (4-/5) Left Knee Extension:  (4-/5) Left Ankle Dorsiflexion: 5/5 Left Ankle Plantar Flexion:  (4-/5)  Exercise/Treatments   Standing Heel Raises: 10 reps;Limitations Heel Raises Limitations: L only Seated   Supine Short Arc Quad Sets: Strengthening;Left;10 reps;Limitations Short Arc Quad Sets Limitations: 3,5,8# x 10   Prone  Hamstring Curl: 10 reps;Limitations Hamstring Curl Limitations: 5# Hip Extension: 10 reps;Limitations Hip Extension Limitations: 5#    Physical Therapy Assessment and Plan PT Assessment and Plan Clinical Impression Statement: Pt with decreased strength, stiffness and pain making it difficult to walk. Rehab Potential: Good PT Frequency: Min 3X/week PT Duration: 4 weeks PT Treatment/Interventions: Gait training;Therapeutic activities;Therapeutic exercise PT Plan: begin bike, rocker board, minisquat, terminal standing ext. terminal supine ext.    Goals Home Exercise Program Pt will Perform Home Exercise Program: Independently PT Short Term Goals Time to Complete Short Term Goals: 2 weeks PT Short Term Goal 1: pain no greater than a 3 so pt is no longer need pain meds. PT Short Term Goal 2: Pt to be able to ride in car comfortably for an hour. PT Short Term Goal 3: Pt able to walk for an hour without increase pain PT Short Term Goal 4: Pt able to stand for 20-30  to grill or  cook. PT Long Term Goals Time to Complete Long Term Goals: 4 weeks PT Long Term Goal 1: Pt I in advance HEP PT Long Term Goal 2: Pain to be no greater than a 1 Long Term Goal 3: Pt to be able to sit for two hours without betting up comfortable. Long Term Goal 4: Pt to be able to walk for 3 hours without sitting  down.  Problem List Patient Active Problem List  Diagnoses  . PCL injury  . Medial meniscus, posterior horn derangement  . OA (osteoarthritis) of knee  . PFS (patellofemoral syndrome)  . Stiffness of joint, not elsewhere classified, lower leg  . Difficulty in walking  . Weakness of left leg    PT - End of Session Activity Tolerance: Patient tolerated treatment well General Behavior During Session: [facility] for tasks performed Cognition: [facility] for tasks performed PT Plan of Care PT Home Exercise Plan: given Consulted and Agree with Plan of Care: Patient  GP Ryan Robbins,CINDY 08/13/2011, 1:38 PM  Physician Documentation Your signature is required to indicate approval of the treatment plan as stated above.  Please sign and either send electronically or make a copy of this report for your files and return this physician signed original.   Please mark one 1.__approve of plan  2. ___approve of plan with the following conditions.   ______________________________                                                          _____________________ Physician Signature                                                                                                             Date

## 2011-08-13 NOTE — Patient Instructions (Addendum)
hep

## 2011-08-14 ENCOUNTER — Ambulatory Visit (HOSPITAL_COMMUNITY)
Admission: RE | Admit: 2011-08-14 | Discharge: 2011-08-14 | Disposition: A | Discharge status: Home / Self Care | Payer: BC Managed Care – PPO | Source: Ambulatory Visit | Attending: Orthopedic Surgery | Admitting: Orthopedic Surgery

## 2011-08-14 NOTE — Progress Notes (Signed)
Physical Therapy Treatment Patient Details  Name: Ryan Robbins MRN: [id number] Date of Birth: 1970-10-17  Today's Date: 08/14/2011 Time: 2841-3244 Time Calculation (min): 38 min Visit#: 2  of 12   Re-eval: 09/12/11 Charges: Therex x 30'   Subjective: Symptoms/Limitations Symptoms: Pt reports HEP compliance. Pt also states that he is pain free. Pain Assessment Currently in Pain?: No/denies Pain Score: 0-No pain   Exercise/Treatments Standing Heel Raises: 10 reps;Limitations Heel Raises Limitations: L only Lateral Step Up: 10 reps;Left;Step Height: 4" Forward Step Up: 10 reps;Left;Step Height: 4" Functional Squat: 10 reps Rocker Board: 2 minutes Seated Long Arc Quad: 10 reps;Limitations Long Arc Quad Limitations: 3#x10 5# x 10 Supine Short Arc Quad Sets: Strengthening;Left;10 reps;Limitations Short Arc Quad Sets Limitations: 3,5,8# x 10 Bridges: 10 reps Bridges Limitations: 5" holds Prone  Hamstring Curl: 15 reps Hamstring Curl Limitations: 5# Hip Extension: 15 reps Hip Extension Limitations: 5#   Physical Therapy Assessment and Plan PT Assessment and Plan Clinical Impression Statement: Pt completes all exercises with excellent form and minimal need for cueing. Began new exercises (see doc flow sheets) with minimal difficulty. Pt is without complaint throughout session. Pt states slight pain increase to 2/10 at end of session. Pt denies need for ice. He states he will ice at home. PT Plan: Continue to progress per PT POC. Begin forward step downs and SLS next session.     Problem List Patient Active Problem List  Diagnoses  . PCL injury  . Medial meniscus, posterior horn derangement  . OA (osteoarthritis) of knee  . PFS (patellofemoral syndrome)  . Stiffness of joint, not elsewhere classified, lower leg  . Difficulty in walking  . Weakness of left leg    PT - End of Session Activity Tolerance: Patient tolerated treatment well General Behavior During  Session: [facility] for tasks performed Cognition: [facility] for tasks performed   Seth Bake, PTA 08/14/2011, 12:29 PM

## 2011-08-18 ENCOUNTER — Ambulatory Visit (HOSPITAL_COMMUNITY)
Admission: RE | Admit: 2011-08-18 | Discharge: 2011-08-18 | Disposition: A | Discharge status: Home / Self Care | Payer: BC Managed Care – PPO | Source: Ambulatory Visit | Attending: Family Medicine | Admitting: Family Medicine

## 2011-08-18 DIAGNOSIS — M25569 Pain in unspecified knee: Secondary | ICD-10-CM | POA: Insufficient documentation

## 2011-08-18 DIAGNOSIS — R262 Difficulty in walking, not elsewhere classified: Secondary | ICD-10-CM | POA: Insufficient documentation

## 2011-08-18 DIAGNOSIS — M6281 Muscle weakness (generalized): Secondary | ICD-10-CM | POA: Insufficient documentation

## 2011-08-18 DIAGNOSIS — IMO0001 Reserved for inherently not codable concepts without codable children: Secondary | ICD-10-CM | POA: Insufficient documentation

## 2011-08-18 DIAGNOSIS — M25669 Stiffness of unspecified knee, not elsewhere classified: Secondary | ICD-10-CM | POA: Insufficient documentation

## 2011-08-18 NOTE — Progress Notes (Signed)
Physical Therapy Treatment Patient Details  Name: Ryan Robbins MRN: [id number] Date of Birth: 1970/07/15  Today's Date: 08/18/2011 Time: 7829-5621 Time Calculation (min): 40 min Visit#: 3  of 12   Re-eval: 09/12/11 Charges: Therex x 32'  Subjective: Symptoms/Limitations Symptoms: Pt states that he is having some soreness today. He believes it was the way he slept. Pain Assessment Currently in Pain?: Yes Pain Score:   3 Pain Location: Knee Pain Orientation: Left;Mid   Exercise/Treatments Aerobic Stationary Bike: 6'@2 .0 seat 9 Standing Heel Raises: 15 reps Heel Raises Limitations: L only Lateral Step Up: 10 reps;Left;Step Height: 4" Forward Step Up: 10 reps;Left;Step Height: 4" Functional Squat: 10 reps Rocker Board: 2 minutes SLS: 1' Rebounder: 10x w/red ball Seated Long Arc Quad: 15 reps Long Arc Quad Limitations: 3# x 15 5# x 15 Supine Short Arc [employer] Sets: 15 reps Short Arc Quad Sets Limitations: 3,5,8# x 15 Bridges: 10 reps Bridges Limitations: 5" holds Straight Leg Raises: 10 reps;Left;Limitations Straight Leg Raises Limitations: 3# Prone  Hamstring Curl: 20 reps Hamstring Curl Limitations: 5#   Physical Therapy Assessment and Plan PT Assessment and Plan Clinical Impression Statement: Pt presents with slight increase in difficulty with exercises secondary to soreness. Pt continues to complete exercises with proper form and minimal need for cueing. Began forward steps down with minimal difficulty to improve eccentric quad control. Pt presents with good stability with SLS exercises. Pt reports no change in pain at end of session. PT Plan: Conitnue to progress per PT POC.    Problem List Patient Active Problem List  Diagnoses  . PCL injury  . Medial meniscus, posterior horn derangement  . OA (osteoarthritis) of knee  . PFS (patellofemoral syndrome)  . Stiffness of joint, not elsewhere classified, lower leg  . Difficulty in walking  . Weakness of left leg      PT - End of Session Activity Tolerance: Patient tolerated treatment well General Behavior During Session: [facility] for tasks performed Cognition: [facility] for tasks performed   Seth Bake, PTA 08/18/2011, 12:24 PM

## 2011-08-20 ENCOUNTER — Ambulatory Visit (HOSPITAL_COMMUNITY)
Admission: RE | Admit: 2011-08-20 | Discharge: 2011-08-20 | Disposition: A | Discharge status: Home / Self Care | Payer: BC Managed Care – PPO | Source: Ambulatory Visit | Attending: Family Medicine | Admitting: Family Medicine

## 2011-08-20 NOTE — Progress Notes (Signed)
Physical Therapy Treatment Patient Details  Name: Ryan Robbins MRN: [id number] Date of Birth: Jun 10, 1970  Today's Date: 08/20/2011 Time: 4098-1191 Time Calculation (min): 36 min Visit#: 4  of 12   Re-eval: 09/12/11 Diagnosis: medial menisectomy Surgical Date: 08/08/11 Next MD Visit: 08/25/11  Subjective: Symptoms/Limitations Symptoms: Pt. states he is having no pain or soreness today, continues to ambulate with a limp. Pain Assessment Currently in Pain?: No/denies  Exercises Instructed by Trilby Leaver, SPTA under the direction of Gustaf Mccarter Bascom Levels, PTA/CI. Exercise/Treatments Aerobic Stationary Bike: 6'@2 .0 seat 9 Elliptical: add next visit Standing Heel Raises: 15 reps Heel Raises Limitations: L only Lateral Step Up: 15 reps;Step Height: 4" Forward Step Up: 15 reps;Step Height: 4" Step Down: 10 reps Functional Squat: 15 reps Rocker Board: 2 minutes Rebounder: 10x w/yellow ball Seated Long Arc Quad: 15 reps Long Arc Quad Limitations: 5#, 8#, 10# 15 reps each Supine Short Arc Quad Sets: 15 reps Short Arc Quad Sets Limitations: 5#, 8#, 10# 15 reps each Bridges: 15 reps Bridges Limitations: 5" holds Straight Leg Raises: 15 reps Straight Leg Raises Limitations: 5# Prone  Hamstring Curl: 15 reps Hamstring Curl Limitations: 8#     Physical Therapy Assessment and Plan PT Assessment and Plan Clinical Impression Statement: Pt. progressing well, able to increase weight with PRE's and increase to yellow ball with rebounder.  Added forward step downs without difficulty. PT Plan: Progress to 6" step, add foam with rebounder, add lunges and progress to elliptical next visit.     PT - End of Session Activity Tolerance: Patient tolerated treatment well General Behavior During Session: [facility] for tasks performed Cognition: [facility] for tasks performed   Trilby Leaver, SPTA/ Mickell Birdwell B. Bascom Levels, PTA 08/20/2011, 12:07 PM

## 2011-08-22 ENCOUNTER — Ambulatory Visit (HOSPITAL_COMMUNITY)
Admission: RE | Admit: 2011-08-22 | Discharge: 2011-08-22 | Disposition: A | Discharge status: Home / Self Care | Payer: BC Managed Care – PPO | Source: Ambulatory Visit

## 2011-08-22 DIAGNOSIS — R262 Difficulty in walking, not elsewhere classified: Secondary | ICD-10-CM

## 2011-08-22 DIAGNOSIS — M25669 Stiffness of unspecified knee, not elsewhere classified: Secondary | ICD-10-CM

## 2011-08-22 DIAGNOSIS — R29898 Other symptoms and signs involving the musculoskeletal system: Secondary | ICD-10-CM

## 2011-08-22 NOTE — Progress Notes (Signed)
Physical Therapy Evaluation  Patient Details  Name: Ryan Robbins MRN: [id number] Date of Birth: 02/06/1971  Today's Date: 08/22/2011 Time: 4098-1191 Time Calculation (min): 38 min  Visit#: 5  of 12   Re-eval: 09/12/11 Assessment Diagnosis: medial menisectomy Surgical Date: 08/08/11 Next MD Visit: 08/27/2011 Charge:  MMT 1 unit therex 33 min Subjective Symptoms/Limitations Symptoms: Pt stated he has no pain just stiff this morning, pt with MD apt next Wednesday unsure if will be able to make it to apt on Monday due to work so re-eval today. How long can you sit comfortably?: unlimited How long can you stand comfortably?: unlimited How long can you walk comfortably?: unlimited Pain Assessment Currently in Pain?: No/denies  Objective:   Assessment LLE Strength Left Hip Flexion:  (4+/5 was 4/5) Left Hip Extension:  (4+/5 was 4/5) Left Hip ABduction: 5/5 Left Hip ADduction: 5/5 Left Knee Flexion:  (4+/5 was 4-/5) Left Knee Extension: 4/5 (was 4-/2) Left Ankle Dorsiflexion: 5/5 Left Ankle Plantar Flexion: 4/5 (was 4-/5)  Exercise/Treatments Aerobic Elliptical: 5' @ L1 Machines for Strengthening Cybex Knee Extension: 2PL 15 reps L only Cybex Knee Flexion: 3.5 Pl 15 reps L only Standing Heel Raises: 15 reps Heel Raises Limitations: L only no UE A Forward Lunges: Left;10 reps Side Lunges: Left;10 reps Lateral Step Up: 10 reps;Hand Hold: 0;Step Height: 6" Forward Step Up: 10 reps;Hand Hold: 0;Step Height: 6" Step Down: 10 reps;Hand Hold: 0;Step Height: 6" Wall Squat: 10 reps;10 seconds Rocker Board: 2 minutes (R/L and A/P) Rebounder: 10x w/yellow ball L only on foam  Physical Therapy Assessment and Plan PT Assessment and Plan Clinical Impression Statement: Re-eval complete prior MD apt next weeks.  Ryan Robbins has had 5 OPPT sessions over 1 1/2 weeks.  Pt has met 4/4 STGs and 1/4 LTGs.  Pt has improved ability to sit, stand and walk for unlimited amount of time with no  pain just fatigue.  Strength has improved all musculature though still impaired.  Progressed therex this session, swiched aerobic to elliptical for endurance training, able to increase height with stair training, began cybex strengthening machines for quad and ham and progressed to rebounder with foam for improved ankle stragety with balance, pt able to complete all new activities with no c/o pain but visible fatigue noted with activity. PT Plan: Continue progressing per PT POC.    Goals Home Exercise Program Pt will Perform Home Exercise Program: Independently PT Goal: Perform Home Exercise Program - Progress: Met PT Short Term Goals Time to Complete Short Term Goals: 2 weeks PT Short Term Goal 1: pain no greater than a 3 so pt is no longer need pain meds. PT Short Term Goal 1 - Progress: Met PT Short Term Goal 2: Pt to be able to ride in car comfortably for an hour. PT Short Term Goal 2 - Progress: Met PT Short Term Goal 3: Pt able to walk for an hour without increase pain PT Short Term Goal 3 - Progress: Met PT Short Term Goal 4: Pt able to stand for 20-30  to grill or cook. PT Short Term Goal 4 - Progress: Met PT Long Term Goals Time to Complete Long Term Goals: 4 weeks PT Long Term Goal 1: Pt I in advance HEP PT Long Term Goal 1 - Progress: Not met PT Long Term Goal 2: Pain to be no greater than a 1 PT Long Term Goal 2 - Progress: Not met Long Term Goal 3: Pt to be able to sit for  two hours without betting up comfortable. Long Term Goal 3 Progress: Met Long Term Goal 4: Pt to be able to walk for 3 hours without sitting down. Long Term Goal 4 Progress: Progressing toward goal  Problem List Patient Active Problem List  Diagnoses  . PCL injury  . Medial meniscus, posterior horn derangement  . OA (osteoarthritis) of knee  . PFS (patellofemoral syndrome)  . Stiffness of joint, not elsewhere classified, lower leg  . Difficulty in walking  . Weakness of left leg    PT - End of  Session Activity Tolerance: Patient tolerated treatment well General Behavior During Session: [facility] for tasks performed Cognition: [facility] for tasks performed  Juel Burrow, PTA 08/22/2011, 12:29 PM

## 2011-08-25 ENCOUNTER — Ambulatory Visit (HOSPITAL_COMMUNITY)
Admission: RE | Admit: 2011-08-25 | Discharge: 2011-08-25 | Disposition: A | Discharge status: Home / Self Care | Payer: BC Managed Care – PPO | Source: Ambulatory Visit | Attending: Family Medicine | Admitting: Family Medicine

## 2011-08-25 NOTE — Progress Notes (Signed)
Physical Therapy Treatment Patient Details  Name: Ryan Robbins MRN: [id number] Date of Birth: [birth year]  Today's Date: 08/25/2011 Time: 4098-1191 Time Calculation (min): 36 min Visit#: 6  of 12   Re-eval: 09/12/11 Diagnosis: medial menisectomy Surgical Date: 08/08/11 Next MD Visit: 08/27/2011 Charges:  therex 25', ice 10'  Subjective: Symptoms/Limitations Symptoms: Pt. states he only has pain when he lays on his side/gets stiff.  Currently without pain, however with continued slight limp with gait. Pain Assessment Currently in Pain?: No/denies  Exercise/Treatments Aerobic Elliptical: 7'@ L2 Machines for Strengthening Cybex Knee Extension: 2PL 15 reps L only Cybex Knee Flexion: 3.5 Pl 15 reps L only Standing Lateral Step Up: 15 reps;Step Height: 6";Hand Hold: 0 Forward Step Up: 15 reps;Step Height: 6";Hand Hold: 0 Step Down: 15 reps;Step Height: 6";Hand Hold: 0 Wall Squat: 15 reps;5 seconds Lunge Walking - Round Trips: Hydrographic surveyor: 2 minutes Rebounder: 20X w/blue ball L only on foam   Modalities Modalities: Cryotherapy Cryotherapy Number Minutes Cryotherapy: 10 Minutes Cryotherapy Location: Knee Type of Cryotherapy: Ice pack  Physical Therapy Assessment and Plan PT Assessment and Plan Clinical Impression Statement: Progressed pt. to progressive lunges without difficulty and in good form; increased reps and difficulty of rebounder/elliptical.  No pain voiced with any activity.Progressing well overall. PT Plan: Add treadmill next visit to work on decreasing antalgia; progress stability exercises.      Problem List Patient Active Problem List  Diagnoses  . PCL injury  . Medial meniscus, posterior horn derangement  . OA (osteoarthritis) of knee  . PFS (patellofemoral syndrome)  . Stiffness of joint, not elsewhere classified, lower leg  . Difficulty in walking  . Weakness of left leg    PT - End of Session Activity Tolerance: Patient tolerated treatment  well General Behavior During Session: [facility] for tasks performed Cognition: [facility] for tasks performed    Karina Lenderman B. Bascom Levels, PTA 08/25/2011, 12:04 PM

## 2011-08-27 ENCOUNTER — Encounter: Payer: Self-pay | Admitting: Orthopedic Surgery

## 2011-08-27 ENCOUNTER — Ambulatory Visit (HOSPITAL_COMMUNITY)
Admission: RE | Admit: 2011-08-27 | Discharge: 2011-08-27 | Disposition: A | Discharge status: Home / Self Care | Payer: BC Managed Care – PPO | Source: Ambulatory Visit | Attending: Family Medicine | Admitting: Family Medicine

## 2011-08-27 ENCOUNTER — Ambulatory Visit (INDEPENDENT_AMBULATORY_CARE_PROVIDER_SITE_OTHER): Payer: BC Managed Care – PPO | Admitting: Orthopedic Surgery

## 2011-08-27 VITALS — BP 124/70 | Ht 68.0 in | Wt 187.0 lb

## 2011-08-27 DIAGNOSIS — Z9889 Other specified postprocedural states: Secondary | ICD-10-CM

## 2011-08-27 DIAGNOSIS — R262 Difficulty in walking, not elsewhere classified: Secondary | ICD-10-CM

## 2011-08-27 DIAGNOSIS — R29898 Other symptoms and signs involving the musculoskeletal system: Secondary | ICD-10-CM

## 2011-08-27 DIAGNOSIS — M25669 Stiffness of unspecified knee, not elsewhere classified: Secondary | ICD-10-CM

## 2011-08-27 MED ORDER — HYDROMORPHONE HCL 2 MG PO TABS: ORAL_TABLET | ORAL | Status: AC

## 2011-08-27 NOTE — Evaluation (Signed)
Physical Therapy Re- Evaluation  Patient Details  Name: Ryan Robbins MRN: [id number] Date of Birth: May 07, 1971  Today's Date: 08/27/2011 Time: 4098-1191 Time Calculation (min): 33 min  Visit#: 7  of 12   Re-eval:   Assessment Diagnosis: medial menisectomy Surgical Date: 08/08/11 Next MD Visit: 08/27/11  Past Medical History:  Past Medical History  Diagnosis Date  . Bronchitis 12/12  . Arthritis    Past Surgical History:  Past Surgical History  Procedure Date  . No past surgeries     Subjective Symptoms/Limitations Symptoms: Pt states he only has pain at night.  He has returned to his previous activiy level.   How long can you sit comfortably?: able to sit as long as he likes. How long can you stand comfortably?: able to stand as long as he likes How long can you walk comfortably?: Able to walk as long as he likes.  He has returned to work activities as of Monday. Special Tests: Pt is waking up 3-4 times a night he is out of pain medication. Pain Assessment Currently in Pain?: Yes Pain Score:  (Worst pain is at night.) Pain Location: Knee Pain Orientation: Left;Medial Pain Type: Surgical pain   Assessment LLE AROM (degrees) Left Knee Extension 0-130:  (0 was 8) Left Knee Flexion 0-140:  (140 was 127) LLE Strength Left Hip Flexion:  (5/5 was 4/5) Left Hip Extension:  (5/5 was 4/5) Left Hip ABduction: 5/5 Left Hip ADduction: 5/5 Left Knee Flexion: 5/5 (1 max rep is at 100%) Left Knee Extension: 4/5 (one max rep shows knee is 82% of L; initial eval one rep max) Left Ankle Dorsiflexion: 5/5 Left Ankle Plantar Flexion:  (5/5 was 4/5)  Exercise/Treatments  Aerobic Elliptical: 7'@ 3.0  Physical Therapy Assessment and Plan PT Assessment and Plan Clinical Impression Statement: Pt has progressed well recommend discharge from physical therapy.    Goals Home Exercise Program Pt will Perform Home Exercise Program: Independently PT Short Term Goals PT Short Term  Goal 1 - Progress: Met PT Short Term Goal 2 - Progress: Met PT Short Term Goal 3 - Progress: Met PT Short Term Goal 4 - Progress: Met PT Long Term Goals PT Long Term Goal 1 - Progress: Met PT Long Term Goal 2: still has night pain that reaches 3/10 PT Long Term Goal 2 - Progress: Progressing toward goal Long Term Goal 3 Progress: Met Long Term Goal 4 Progress: Progressing toward goal (Pt has not tried to be on his feet for more than three hours)  Problem List Patient Active Problem List  Diagnoses  . PCL injury  . Medial meniscus, posterior horn derangement  . OA (osteoarthritis) of knee  . PFS (patellofemoral syndrome)  . Stiffness of joint, not elsewhere classified, lower leg  . Difficulty in walking  . Weakness of left leg    PT - End of Session Activity Tolerance: Patient tolerated treatment well General Behavior During Session: [facility] for tasks performed Cognition: [facility] for tasks performed  Kenslei Hearty,CINDY 08/27/2011, 10:36 AM  Physician Documentation Your signature is required to indicate approval of the tr eatment plan as stated above.  Please sign and either send electronically or make a copy of this report for your files and return this physician signed original.   Please mark one 1.__approve of plan  2. ___approve of plan with the following conditions.   ______________________________  _____________________ Physician Signature                                                                                                             Date

## 2011-08-27 NOTE — Patient Instructions (Signed)
Return to work full duty 22nd  April

## 2011-08-27 NOTE — Progress Notes (Signed)
Patient ID: Ryan Robbins, male   DOB: 1970-09-04, 41 y.o.   MRN: [id number] Chief Complaint  Patient presents with  . Routine Post Op    post op 2, left knee, DOS 08/08/11    Doing well.  Straight leg raise is normal. Full range of motion no swelling.  He is having some pain at night. He will continue his pain medicine for nighttime pain and can return to full duty on 22 April

## 2011-08-29 ENCOUNTER — Ambulatory Visit (HOSPITAL_COMMUNITY): Payer: BC Managed Care – PPO

## 2011-09-01 ENCOUNTER — Ambulatory Visit (HOSPITAL_COMMUNITY): Payer: BC Managed Care – PPO | Admitting: *Deleted

## 2011-09-03 ENCOUNTER — Ambulatory Visit (HOSPITAL_COMMUNITY): Payer: BC Managed Care – PPO | Admitting: Physical Therapy

## 2011-09-05 ENCOUNTER — Ambulatory Visit (HOSPITAL_COMMUNITY): Payer: BC Managed Care – PPO | Admitting: Physical Therapy

## 2011-09-08 ENCOUNTER — Ambulatory Visit (HOSPITAL_COMMUNITY): Payer: BC Managed Care – PPO | Admitting: *Deleted

## 2011-09-10 ENCOUNTER — Ambulatory Visit (HOSPITAL_COMMUNITY): Payer: BC Managed Care – PPO | Admitting: Physical Therapy

## 2011-09-12 ENCOUNTER — Ambulatory Visit (HOSPITAL_COMMUNITY): Payer: BC Managed Care – PPO | Admitting: Physical Therapy

## 2011-10-07 ENCOUNTER — Telehealth: Payer: Self-pay | Admitting: Orthopedic Surgery

## 2011-10-07 NOTE — Telephone Encounter (Signed)
He can have some benadryl with the vicodin but dilaudid is a restrcted medication

## 2011-10-07 NOTE — Telephone Encounter (Signed)
Ragan Ehlert's wife, Kaleen Odea called for [employer]. He had knee surgery 08/08/11 and he is still having some knee pain. Asking for a prescription for Dilaudid 2 mg.  Has had a prescription for Vicodin but it makes him itch.  If you change to another pain medicine, he uses [employer] . His # [phone]

## 2011-10-08 MED ORDER — HYDROCODONE-ACETAMINOPHEN 5-500 MG PO TABS: 1.0000 | ORAL_TABLET | Freq: Four times a day (QID) | ORAL | Status: AC | PRN

## 2011-10-08 NOTE — Telephone Encounter (Signed)
Ryan Robbins, will you call the patient with these instructuions?  Thanks, BF

## 2011-10-08 NOTE — Telephone Encounter (Signed)
vicodin called in to [facility] patient aware

## 2013-01-04 ENCOUNTER — Ambulatory Visit (INDEPENDENT_AMBULATORY_CARE_PROVIDER_SITE_OTHER): Payer: BC Managed Care – PPO | Admitting: Urology

## 2013-01-04 DIAGNOSIS — N469 Male infertility, unspecified: Secondary | ICD-10-CM

## 2013-06-08 ENCOUNTER — Ambulatory Visit (HOSPITAL_COMMUNITY)
Admission: RE | Admit: 2013-06-08 | Discharge: 2013-06-08 | Disposition: A | Discharge status: Home / Self Care | Payer: BC Managed Care – PPO | Source: Ambulatory Visit | Attending: Family Medicine | Admitting: Family Medicine

## 2013-06-08 ENCOUNTER — Other Ambulatory Visit (HOSPITAL_COMMUNITY): Payer: Self-pay | Admitting: Family Medicine

## 2013-06-08 DIAGNOSIS — M2550 Pain in unspecified joint: Secondary | ICD-10-CM

## 2013-06-08 DIAGNOSIS — M259 Joint disorder, unspecified: Secondary | ICD-10-CM | POA: Insufficient documentation

## 2013-06-08 DIAGNOSIS — M79609 Pain in unspecified limb: Secondary | ICD-10-CM | POA: Insufficient documentation

## 2014-10-24 IMAGING — CR DG HAND COMPLETE 3+V*R*
3 series · 3 of 3 positions shown · non-contrast
Comparison: None.

CLINICAL DATA: Finger pain

EXAM:
RIGHT HAND - COMPLETE 3+ VIEW

[view not recorded (1 of 3)]
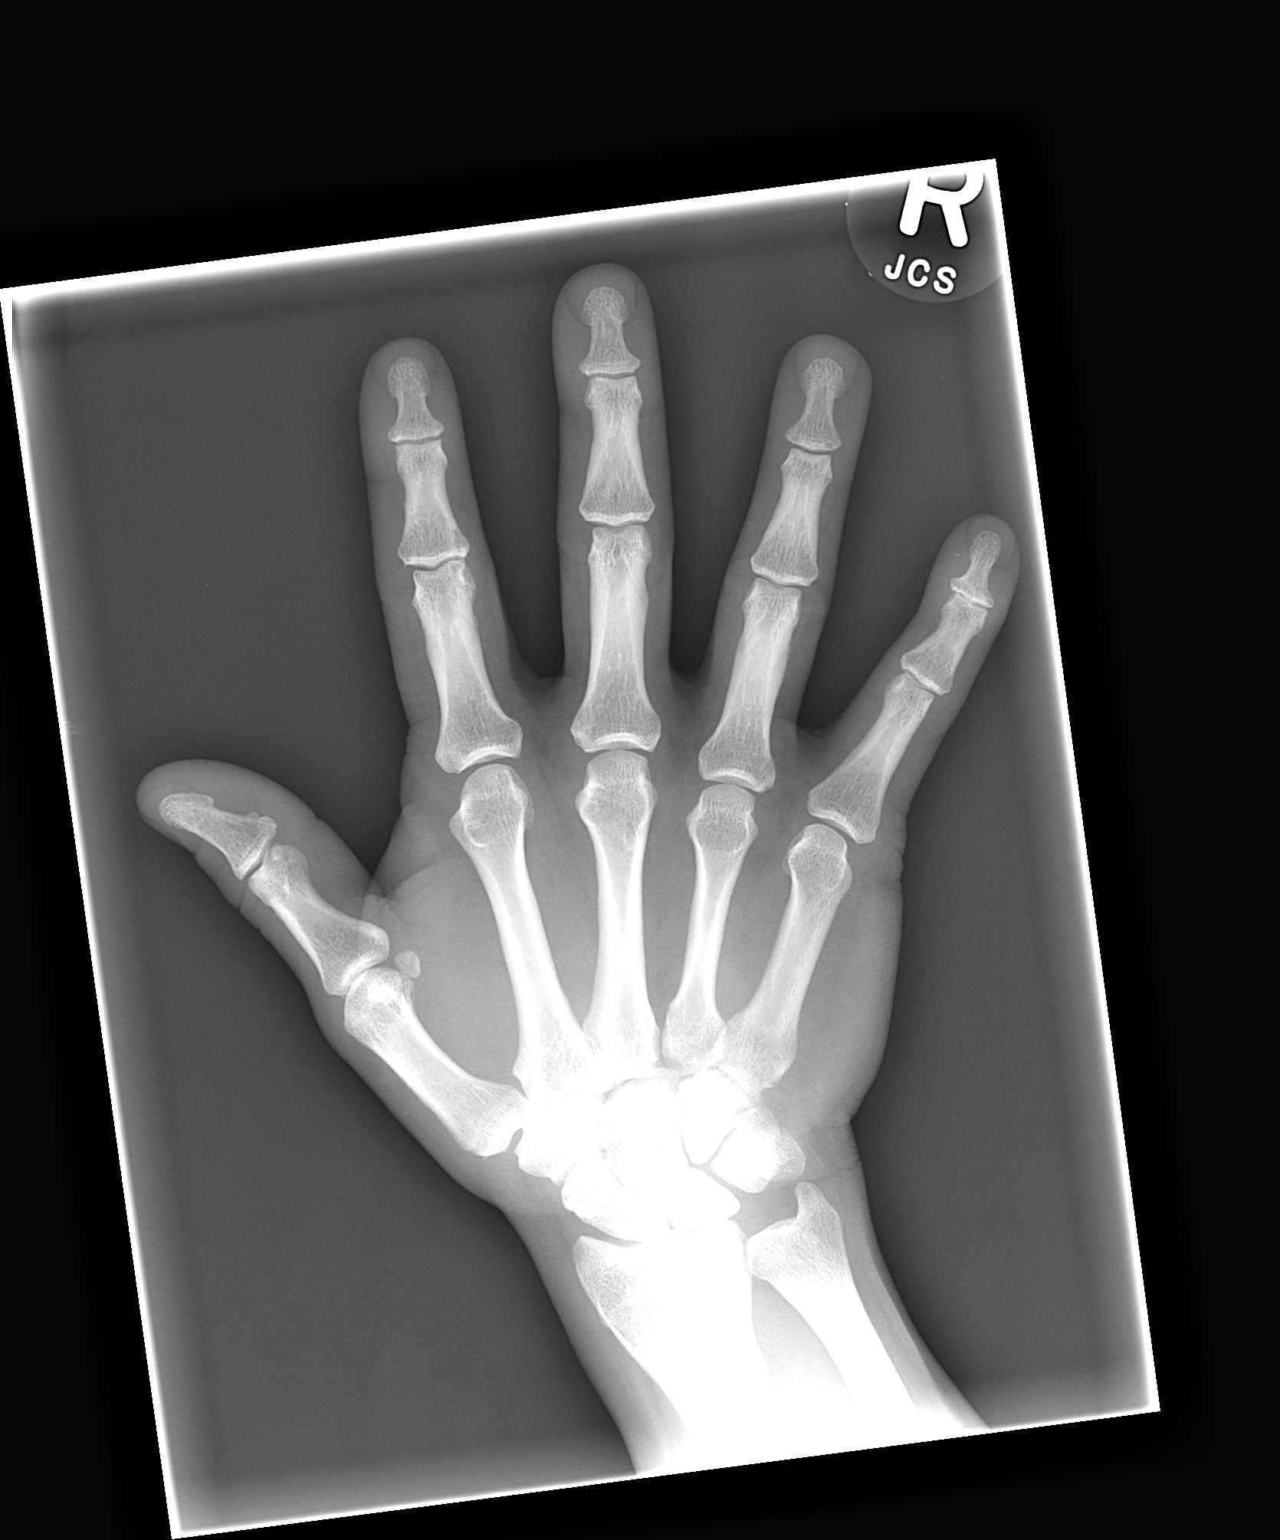

[view not recorded (2 of 3)]
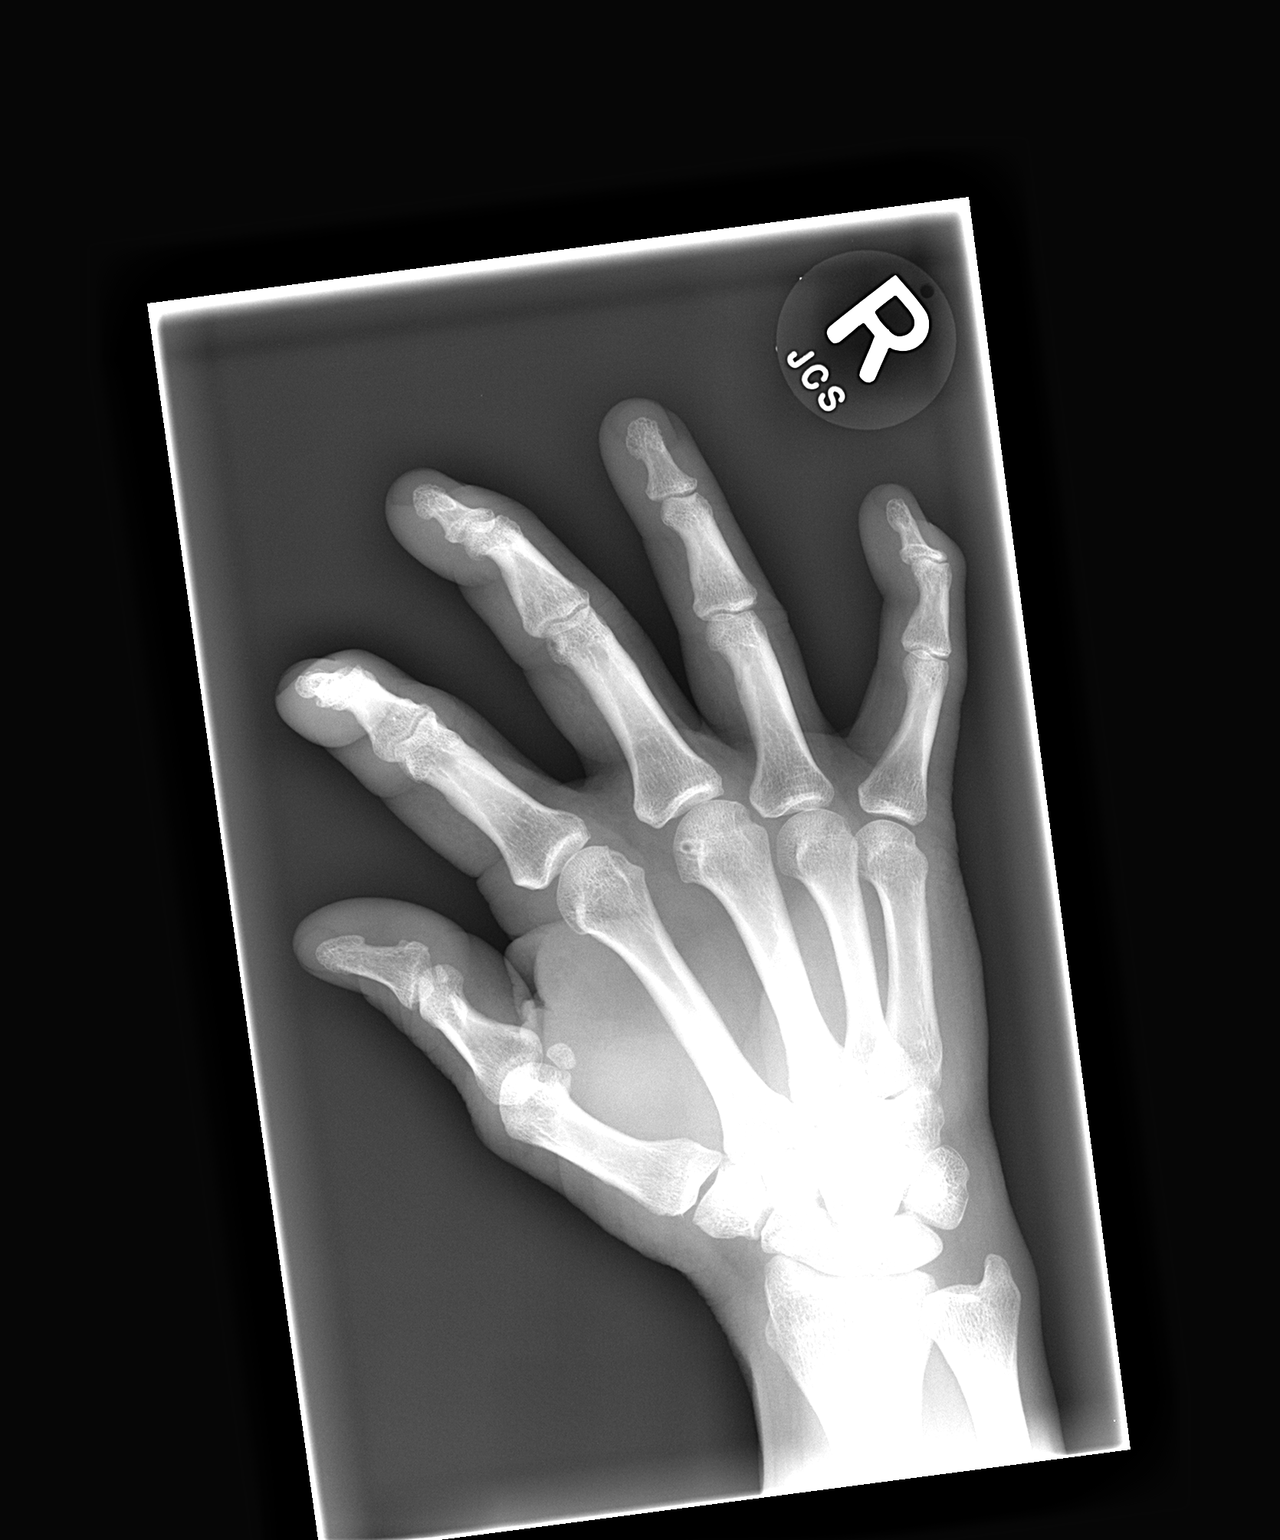

[view not recorded (3 of 3)]
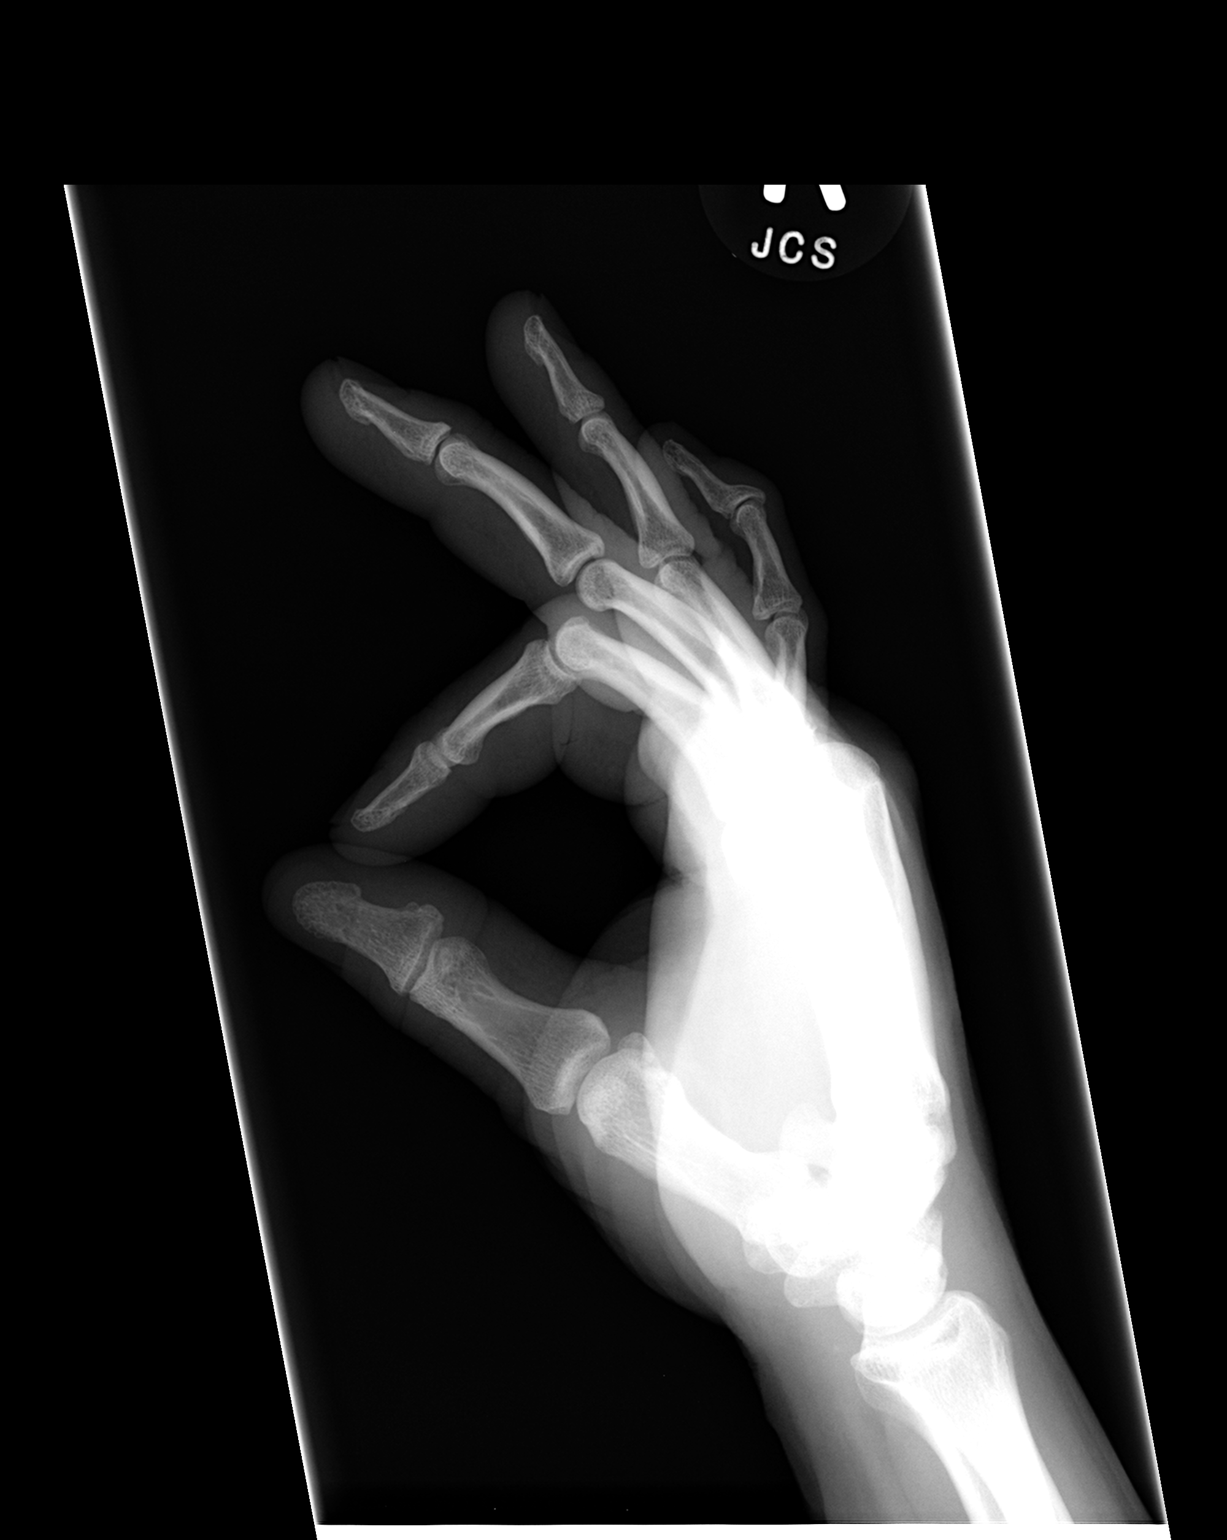

[3 of 3 positions shown; findings below may reference images not displayed]

FINDINGS: Three views of right hand submitted. No acute fracture or
subluxation. Mild degenerative changes distal interphalangeal joint
fifth finger.
IMPRESSION: Negative. Mild degenerative changes distal interphalangeal joint
fifth finger.
